# Patient Record
Sex: Female | Born: 1960 | Race: White | Hispanic: No | Marital: Married | Smoking: Never smoker
Health system: Southern US, Community
[De-identification: ages and names within clinical notes are randomized; demographics above are authoritative.]

## PROBLEM LIST (undated history)

## (undated) DIAGNOSIS — A498 Other bacterial infections of unspecified site: Secondary | ICD-10-CM

## (undated) DIAGNOSIS — E079 Disorder of thyroid, unspecified: Secondary | ICD-10-CM

## (undated) HISTORY — PX: OOPHORECTOMY: SHX86

## (undated) HISTORY — PX: ABDOMINAL HYSTERECTOMY: SHX81

## (undated) HISTORY — PX: CARPAL TUNNEL RELEASE: SHX101

---

## 1995-08-01 HISTORY — PX: ABDOMINAL HYSTERECTOMY: SHX81

## 2012-07-31 HISTORY — PX: OOPHORECTOMY: SHX86

## 2012-12-03 ENCOUNTER — Other Ambulatory Visit: Payer: Self-pay

## 2012-12-03 DIAGNOSIS — Z1231 Encounter for screening mammogram for malignant neoplasm of breast: Secondary | ICD-10-CM

## 2014-05-08 ENCOUNTER — Emergency Department (HOSPITAL_BASED_OUTPATIENT_CLINIC_OR_DEPARTMENT_OTHER)
Admission: EM | Admit: 2014-05-08 | Discharge: 2014-05-08 | Disposition: A | Discharge status: Home / Self Care | Payer: Commercial Indemnity | Attending: Emergency Medicine | Admitting: Emergency Medicine

## 2014-05-08 ENCOUNTER — Emergency Department (HOSPITAL_BASED_OUTPATIENT_CLINIC_OR_DEPARTMENT_OTHER): Payer: Commercial Indemnity

## 2014-05-08 ENCOUNTER — Encounter (HOSPITAL_BASED_OUTPATIENT_CLINIC_OR_DEPARTMENT_OTHER): Payer: Self-pay | Admitting: Emergency Medicine

## 2014-05-08 DIAGNOSIS — Z8639 Personal history of other endocrine, nutritional and metabolic disease: Secondary | ICD-10-CM | POA: Diagnosis not present

## 2014-05-08 DIAGNOSIS — M79604 Pain in right leg: Secondary | ICD-10-CM | POA: Insufficient documentation

## 2014-05-08 DIAGNOSIS — R2242 Localized swelling, mass and lump, left lower limb: Secondary | ICD-10-CM | POA: Insufficient documentation

## 2014-05-08 DIAGNOSIS — F419 Anxiety disorder, unspecified: Secondary | ICD-10-CM | POA: Insufficient documentation

## 2014-05-08 DIAGNOSIS — M79605 Pain in left leg: Secondary | ICD-10-CM | POA: Diagnosis present

## 2014-05-08 DIAGNOSIS — R0602 Shortness of breath: Secondary | ICD-10-CM | POA: Insufficient documentation

## 2014-05-08 DIAGNOSIS — R102 Pelvic and perineal pain: Secondary | ICD-10-CM | POA: Insufficient documentation

## 2014-05-08 DIAGNOSIS — M79606 Pain in leg, unspecified: Secondary | ICD-10-CM

## 2014-05-08 DIAGNOSIS — R2241 Localized swelling, mass and lump, right lower limb: Secondary | ICD-10-CM | POA: Diagnosis not present

## 2014-05-08 HISTORY — DX: Disorder of thyroid, unspecified: E07.9

## 2014-05-08 LAB — COMPREHENSIVE METABOLIC PANEL
ALK PHOS: 121 U/L — AB (ref 39–117)
ALT: 15 U/L (ref 0–35)
ANION GAP: 14 (ref 5–15)
AST: 27 U/L (ref 0–37)
Albumin: 4.2 g/dL (ref 3.5–5.2)
BILIRUBIN TOTAL: 0.5 mg/dL (ref 0.3–1.2)
BUN: 10 mg/dL (ref 6–23)
CHLORIDE: 102 meq/L (ref 96–112)
CO2: 28 mEq/L (ref 19–32)
Calcium: 10.1 mg/dL (ref 8.4–10.5)
Creatinine, Ser: 0.7 mg/dL (ref 0.50–1.10)
GFR calc non Af Amer: >90 mL/min (ref >90)
GLUCOSE: 100 mg/dL — AB (ref 70–99)
POTASSIUM: 3.6 meq/L — AB (ref 3.7–5.3)
Sodium: 144 mEq/L (ref 137–147)
Total Protein: 8.1 g/dL (ref 6.0–8.3)

## 2014-05-08 LAB — URINALYSIS, ROUTINE W REFLEX MICROSCOPIC
Bilirubin Urine: NEGATIVE
GLUCOSE, UA: NEGATIVE mg/dL
HGB URINE DIPSTICK: NEGATIVE
Ketones, ur: NEGATIVE mg/dL
Leukocytes, UA: NEGATIVE
Nitrite: NEGATIVE
Protein, ur: NEGATIVE mg/dL
SPECIFIC GRAVITY, URINE: 1.002 — AB (ref 1.005–1.030)
UROBILINOGEN UA: 0.2 mg/dL (ref 0.0–1.0)
pH: 7 (ref 5.0–8.0)

## 2014-05-08 LAB — CBC WITH DIFFERENTIAL/PLATELET
Basophils Absolute: 0 10*3/uL (ref 0.0–0.1)
Basophils Relative: 1 % (ref 0–1)
Eosinophils Absolute: 0.1 10*3/uL (ref 0.0–0.7)
Eosinophils Relative: 2 % (ref 0–5)
HCT: 41.2 % (ref 36.0–46.0)
HEMOGLOBIN: 13.9 g/dL (ref 12.0–15.0)
LYMPHS ABS: 1.4 10*3/uL (ref 0.7–4.0)
Lymphocytes Relative: 30 % (ref 12–46)
MCH: 31.6 pg (ref 26.0–34.0)
MCHC: 33.7 g/dL (ref 30.0–36.0)
MCV: 93.6 fL (ref 78.0–100.0)
MONOS PCT: 8 % (ref 3–12)
Monocytes Absolute: 0.4 10*3/uL (ref 0.1–1.0)
NEUTROS ABS: 2.8 10*3/uL (ref 1.7–7.7)
NEUTROS PCT: 59 % (ref 43–77)
Platelets: 217 10*3/uL (ref 150–400)
RBC: 4.4 MIL/uL (ref 3.87–5.11)
RDW: 12.2 % (ref 11.5–15.5)
WBC: 4.7 10*3/uL (ref 4.0–10.5)

## 2014-05-08 LAB — D-DIMER, QUANTITATIVE (NOT AT ARMC)

## 2014-05-08 NOTE — ED Notes (Signed)
Pt returned from radiology.

## 2014-05-08 NOTE — ED Provider Notes (Signed)
CSN: [id number]     Arrival date & time 05/08/14  1551 History   First MD Initiated Contact with Patient 05/08/14 1553     Chief Complaint  Patient presents with  . Leg Pain     (Consider location/radiation/quality/duration/timing/severity/associated sxs/prior Treatment) HPI 53 year old female presents today complaining of bilateral leg pain. She states that she has had leg pain for several months. She had an oophorectomy bilaterally in August of 2014. During the next several months after that she had some leg pain and swelling. She was eventually sent to a vascular surgeon. 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 She was told these were not DVTs and not need further treatment. She continued to have pain with central vascular surgeon. 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 He is scheduled to do a surgery in the near future. She states that she has had labs drawn by her primary care physician who is a gynecologist and been told that her calcium was slightly high. She has had some thyroid problems but is followed by a naturopathic physician for this and takes an over-the-counter medicine. She has had her thyroid screened and states that these have been normal. She states she's had ongoing pain and swelling in her legs and hands. She feels like the leg pain has worsened over the past several weeks with pain worse in the right leg than the left leg. She has been concerned about a DVT. The pain is worse with both rest and movement. She has taken over-the-counter pain medicine without relief. She has not had any trauma, redness, streaking, cords or other signs of problems in her lower extremities besides the pain and swelling. She felt when she walked in the door but she was anxious and was asked to she had shortness of breath. She states they then brought him that she  was short of breath making it is her chief complaint she has not had that as a chief complaint at home. She has not noted chest pain, productive cough, pleuritic pain, or weight loss. She states she does have some pelvic pressure and pain. She has not had any urinary tract infection symptoms but was seen and told that she had blood in her urine by her gynecologist approximately 6 weeks ago. She was put on a course of Bactrim and continued to have blood in her urine was sent to urologist. She states that they have told her they plan to do further exam of the inside of her bladder to make sure she does not have any tumor or cause of the bleeding. She has not noted this herself and has not seen any gross blood or had any pain. Past Medical History  Diagnosis Date  . Thyroid disease    Past Surgical History  Procedure Laterality Date  . Abdominal hysterectomy    . Oophorectomy     No family history on file. History  Substance Use Topics  . Smoking status: Never Smoker   . Smokeless tobacco: Not on file  . Alcohol Use: No   OB History   Grav Para Term Preterm Abortions TAB SAB Ect Mult Living                 Review of Systems  All other systems reviewed and are negative.     Allergies  Macrodantin; Morphine and related; and Reglan  Home Medications   Prior to Admission medications   Medication Sig Start Date End Date Taking? Authorizing Provider  NONFORMULARY OR COMPOUNDED ITEM    Yes Historical Provider, MD   BP 153/87  Pulse 88  Temp(Src) 97.8 F (36.6 C) (Oral)  Resp 16  Ht 5\' 4"  (1.626 m)  Wt 123 lb (55.792 kg)  BMI 21.10 kg/m2  SpO2 100% Physical Exam  Nursing note and vitals reviewed. Constitutional: She is oriented to person, place, and time. She appears well-developed and well-nourished.  HENT:  Head: Normocephalic and atraumatic.  Right Ear: External ear normal.  Left Ear: External ear normal.  Nose: Nose normal.  Mouth/Throat: Oropharynx is clear and moist.   Eyes: Conjunctivae and EOM are normal. Pupils are equal, round, and reactive to light.  Neck: Normal range of motion. Neck supple. No JVD present. No tracheal deviation present. No thyromegaly present.  Cardiovascular: Normal rate, regular rhythm, normal heart sounds and intact distal pulses.   Pulmonary/Chest: Effort normal and breath sounds normal. No respiratory distress. She has no wheezes.  Abdominal: Soft. Bowel sounds are normal. She exhibits no mass. There is no tenderness. There is no guarding.  Musculoskeletal: Normal range of motion.  ttp right medial thigh  Lymphadenopathy:    She has no cervical adenopathy.  Neurological: She is alert and oriented to person, place, and time. She has normal reflexes. No cranial nerve deficit or sensory deficit. Gait normal. GCS eye subscore is 4. GCS verbal subscore is 5. GCS motor subscore is 6.  Reflex Scores:      Bicep reflexes are 2+ on the right side and 2+ on the left side.      Patellar reflexes are 2+ on the right side and 2+ on the left side. Strength is 5/5 bilateral elbow flexor/extensors, wrist extension/flexion, intrinsic hand strength equal Bilateral hip flexion/extension 5/5, knee flexion/extension 5/5, ankle 5/5 flexion extension    Skin: Skin is warm and dry.  Psychiatric: She has a normal mood and affect. Her behavior is normal. Judgment and thought content normal.    ED Course  Procedures (including critical care time) Labs Review Labs Reviewed  URINALYSIS, ROUTINE W REFLEX MICROSCOPIC - Abnormal; Notable for the following:    Specific Gravity, Urine 1.002 (*)    All other components within normal limits  CBC WITH DIFFERENTIAL  COMPREHENSIVE METABOLIC PANEL  D-DIMER, QUANTITATIVE    Imaging Review Dg Chest 2 View  05/08/2014   CLINICAL DATA:  Leg pain. History of lower extremity blood clots. Shortness of breath.  EXAM: CHEST  2 VIEW  COMPARISON:  11/03/2013  FINDINGS: The heart size and mediastinal contours are  within normal limits. Both lungs are clear. The visualized skeletal structures are unremarkable.  IMPRESSION: No active cardiopulmonary disease.   Electronically Signed   By: Herbie Baltimore M.D.   On: 05/08/2014 16:35   US Transvaginal Non-ob  05/08/2014   CLINICAL DATA:  Patient complains of mid pelvic discomfort and pain since hysterectomy.  EXAM: TRANSABDOMINAL AND TRANSVAGINAL ULTRASOUND OF PELVIS  TECHNIQUE: Both transabdominal and transvaginal ultrasound examinations of the pelvis were performed. Transabdominal technique was performed for global imaging of the pelvis including uterus, ovaries, adnexal regions, and pelvic cul-de-sac. It was necessary to proceed with endovaginal exam following the transabdominal exam to visualize the pelvic structures.  COMPARISON:  None  FINDINGS: The uterus and bilateral ovaries are surgically absent. No pelvic masses are identified. No significant pelvic free fluid.  IMPRESSION: Surgically absent uterus and ovaries.  No pelvic mass identified.   Electronically Signed   By: Annia Belt M.D.   On: 05/08/2014 17:33   US Pelvis Complete  05/08/2014   CLINICAL DATA:  Patient complains of mid pelvic discomfort and pain since hysterectomy.  EXAM: TRANSABDOMINAL AND TRANSVAGINAL ULTRASOUND OF PELVIS  TECHNIQUE: Both transabdominal and transvaginal ultrasound examinations of the pelvis were performed. Transabdominal technique was performed for global imaging of the pelvis including uterus, ovaries, adnexal regions, and pelvic cul-de-sac. It was necessary to proceed with endovaginal exam following the transabdominal exam to visualize the pelvic structures.  COMPARISON:  None  FINDINGS: The uterus and bilateral ovaries are surgically absent. No pelvic masses are identified. No significant pelvic free fluid.  IMPRESSION: Surgically absent uterus and ovaries.  No pelvic mass identified.   Electronically Signed   By: Annia Belt M.D.   On: 05/08/2014 17:33   US Venous Img Lower  Bilateral  05/08/2014   CLINICAL DATA:  Bilateral lower extremity edema in the feet. Bilateral lower leg pain, right greater than left. Right groin pain. Prior diagnosis of superficial thrombophlebitis in the right leg at the vascular surgeon's office.  EXAM: BILATERAL LOWER EXTREMITY VENOUS DOPPLER ULTRASOUND  TECHNIQUE: Gray-scale sonography with graded compression, as well as color Doppler and duplex ultrasound were performed to evaluate the lower extremity deep venous systems from the level of the common femoral vein and including the common femoral, femoral, profunda femoral, popliteal and calf veins including the posterior tibial, peroneal and gastrocnemius veins when visible. The superficial great saphenous vein was also interrogated. Spectral Doppler was utilized to evaluate flow at rest and with distal augmentation maneuvers in the common femoral, femoral and popliteal veins.  COMPARISON:  None.  FINDINGS: RIGHT LOWER EXTREMITY  Common Femoral Vein: No evidence of thrombus. Normal compressibility, respiratory phasicity and response to augmentation.  Saphenofemoral Junction: No evidence of thrombus. Normal compressibility and flow on color Doppler imaging.  Profunda Femoral Vein: No evidence of thrombus. Normal compressibility and flow on color Doppler imaging.  Femoral Vein: No evidence of thrombus. Normal compressibility, respiratory phasicity and response to augmentation.  Popliteal Vein: No evidence of thrombus. Normal compressibility, respiratory phasicity and response to augmentation.  Calf Veins: No evidence of thrombus. Normal compressibility and flow on color Doppler imaging.  Superficial Great Saphenous Vein: No evidence of thrombus. Normal compressibility and flow on color Doppler imaging.  Venous Reflux:  Not examined.  Other Findings:  None.  LEFT LOWER EXTREMITY  Common Femoral Vein: No evidence of thrombus. Normal compressibility, respiratory phasicity and response to augmentation.   Saphenofemoral Junction: No evidence of thrombus. Normal compressibility and flow on color Doppler imaging.  Profunda Femoral Vein: No evidence of thrombus. Normal compressibility and flow on color Doppler imaging.  Femoral Vein: No evidence of thrombus. Normal compressibility, respiratory phasicity and response to augmentation.  Popliteal Vein: No evidence of thrombus. Normal compressibility, respiratory phasicity and response to augmentation.  Calf Veins: No evidence of thrombus. Normal compressibility and flow on color Doppler imaging.  Superficial Great Saphenous Vein: No evidence of thrombus. Normal compressibility and flow on color Doppler imaging.  Venous Reflux:  Not examined.  Other Findings:  None.  IMPRESSION: No evidence of DVT involving either the right or left lower extremity.   Electronically Signed   By: Hulan Saas M.D.   On: 05/08/2014 17:17     EKG Interpretation None      MDM   Final diagnoses:  Pain of lower extremity, unspecified laterality  Patient with bilateral leg pain right greater than rest and no evidence of DVT seen on her ultrasound. D-dimer is normal. Patient has normal electrolytes. I do not see an obvious cause of her pain. I discussed with her having followup to have her thyroid tested as her symptoms could be do to hypothyroidism. I have discussed the findings of her workup here with the patient and her husband and answered questions.    Hilario Quarryanielle S Blaze Sandin, MD 05/08/14 [phone]

## 2014-05-08 NOTE — Discharge Instructions (Signed)
Please followup with your Dr. for further tests of your thyroid. Please return if you're worse or feel that something different is occurring and we can reevaluate you at that time.

## 2014-05-08 NOTE — ED Notes (Signed)
Pt presents with chronic bilateral leg pain since April that is progressively worsening. [facility] contributes to feeling anxious now.

## 2014-05-08 NOTE — ED Notes (Signed)
MD at bedside. 

## 2015-06-16 ENCOUNTER — Ambulatory Visit: Payer: Self-pay | Admitting: Internal Medicine

## 2017-04-09 ENCOUNTER — Encounter (HOSPITAL_BASED_OUTPATIENT_CLINIC_OR_DEPARTMENT_OTHER): Payer: Self-pay | Admitting: *Deleted

## 2017-04-09 ENCOUNTER — Emergency Department (HOSPITAL_BASED_OUTPATIENT_CLINIC_OR_DEPARTMENT_OTHER): Payer: Managed Care, Other (non HMO)

## 2017-04-09 ENCOUNTER — Emergency Department (HOSPITAL_BASED_OUTPATIENT_CLINIC_OR_DEPARTMENT_OTHER)
Admission: EM | Admit: 2017-04-09 | Discharge: 2017-04-09 | Disposition: A | Discharge status: Home / Self Care | Payer: Managed Care, Other (non HMO) | Attending: Emergency Medicine | Admitting: Emergency Medicine

## 2017-04-09 DIAGNOSIS — E079 Disorder of thyroid, unspecified: Secondary | ICD-10-CM | POA: Diagnosis not present

## 2017-04-09 DIAGNOSIS — R079 Chest pain, unspecified: Secondary | ICD-10-CM | POA: Diagnosis not present

## 2017-04-09 DIAGNOSIS — M546 Pain in thoracic spine: Secondary | ICD-10-CM | POA: Diagnosis not present

## 2017-04-09 DIAGNOSIS — M549 Dorsalgia, unspecified: Secondary | ICD-10-CM | POA: Diagnosis present

## 2017-04-09 HISTORY — DX: Other bacterial infections of unspecified site: A49.8

## 2017-04-09 LAB — URINALYSIS, ROUTINE W REFLEX MICROSCOPIC
Bilirubin Urine: NEGATIVE
Glucose, UA: NEGATIVE mg/dL
HGB URINE DIPSTICK: NEGATIVE
Ketones, ur: NEGATIVE mg/dL
Leukocytes, UA: NEGATIVE
Nitrite: NEGATIVE
Protein, ur: NEGATIVE mg/dL
SPECIFIC GRAVITY, URINE: 1.01 (ref 1.005–1.030)
pH: 5.5 (ref 5.0–8.0)

## 2017-04-09 LAB — CBC WITH DIFFERENTIAL/PLATELET
BASOS PCT: 1 %
Basophils Absolute: 0 10*3/uL (ref 0.0–0.1)
EOS PCT: 2 %
Eosinophils Absolute: 0.1 10*3/uL (ref 0.0–0.7)
HCT: 40.9 % (ref 36.0–46.0)
HEMOGLOBIN: 13.8 g/dL (ref 12.0–15.0)
Lymphocytes Relative: 26 %
Lymphs Abs: 1.4 10*3/uL (ref 0.7–4.0)
MCH: 30.9 pg (ref 26.0–34.0)
MCHC: 33.7 g/dL (ref 30.0–36.0)
MCV: 91.7 fL (ref 78.0–100.0)
Monocytes Absolute: 0.5 10*3/uL (ref 0.1–1.0)
Monocytes Relative: 9 %
NEUTROS PCT: 62 %
Neutro Abs: 3.3 10*3/uL (ref 1.7–7.7)
PLATELETS: 210 10*3/uL (ref 150–400)
RBC: 4.46 MIL/uL (ref 3.87–5.11)
RDW: 12.4 % (ref 11.5–15.5)
WBC: 5.3 10*3/uL (ref 4.0–10.5)

## 2017-04-09 LAB — COMPREHENSIVE METABOLIC PANEL
ALK PHOS: 80 U/L (ref 38–126)
ALT: 16 U/L (ref 14–54)
AST: 30 U/L (ref 15–41)
Albumin: 4.4 g/dL (ref 3.5–5.0)
Anion gap: 7 (ref 5–15)
BILIRUBIN TOTAL: 1 mg/dL (ref 0.3–1.2)
BUN: 8 mg/dL (ref 6–20)
CALCIUM: 9.6 mg/dL (ref 8.9–10.3)
CO2: 29 mmol/L (ref 22–32)
CREATININE: 0.66 mg/dL (ref 0.44–1.00)
Chloride: 103 mmol/L (ref 101–111)
GFR calc non Af Amer: >60 mL/min (ref >60)
Glucose, Bld: 96 mg/dL (ref 65–99)
Potassium: 3.7 mmol/L (ref 3.5–5.1)
Sodium: 139 mmol/L (ref 135–145)
TOTAL PROTEIN: 7.8 g/dL (ref 6.5–8.1)

## 2017-04-09 LAB — D-DIMER, QUANTITATIVE: D-Dimer, Quant: 0.38 ug/mL-FEU (ref 0.00–0.50)

## 2017-04-09 MED ORDER — KETOROLAC TROMETHAMINE 30 MG/ML IJ SOLN
30.0000 mg | Freq: Once | INTRAMUSCULAR | Status: AC
  Administered 2017-04-09: 30 mg via INTRAVENOUS

## 2017-04-09 MED ORDER — KETOROLAC TROMETHAMINE 30 MG/ML IJ SOLN
30.0000 mg | Freq: Once | INTRAMUSCULAR | Status: DC
  Filled 2017-04-09: qty 1

## 2017-04-09 MED ORDER — ACETAMINOPHEN 500 MG PO TABS: 1000.0000 mg | ORAL_TABLET | Freq: Once | ORAL | Status: DC

## 2017-04-09 MED ORDER — TRAMADOL HCL 50 MG PO TABS: 50.0000 mg | ORAL_TABLET | Freq: Four times a day (QID) | ORAL | 0 refills | Status: DC | PRN

## 2017-04-09 MED ORDER — HYDROMORPHONE HCL 1 MG/ML IJ SOLN
1.0000 mg | Freq: Once | INTRAMUSCULAR | Status: DC
  Filled 2017-04-09: qty 1

## 2017-04-09 NOTE — ED Notes (Signed)
Pt reports that she can't take IV contrast.  It is not listed as an allergy on her chart.  I asked her what her reaction is and she states that "It messes with with my kidneys, I had trouble urinating and got stiff and aching and had a difficult time walking after that"

## 2017-04-09 NOTE — ED Notes (Signed)
MD with pt  

## 2017-04-09 NOTE — ED Notes (Signed)
Pt does not want the ordered pain medications.  Hot pack given

## 2017-04-09 NOTE — ED Notes (Signed)
To x-ray

## 2017-04-09 NOTE — ED Triage Notes (Signed)
Pt states she had a recent e.coli infection since the end of Aug. Has completed the treatment and had a f/u CT scan Friday that showed "constipation"  C/o nausea. Denies vomiting. States she feels boated. Took Mirilax Sunday with zero results. Pt is here today due to upper left back pain that started Sunday and got worse throughout the day. Describes as sharp. Denies any injury. States she pulled open her sliding glass door which could have contributed to pain.  Denies any sob.

## 2017-04-09 NOTE — ED Provider Notes (Signed)
[facility]   CSN: [id number] Arrival date & time: 04/09/17  0144     History   Chief Complaint Chief Complaint  Patient presents with  . mid upper back pain    HPI Leonetta Mcgivern is a 56 y.o. female.  Patient is a 56 year old female presenting with complaints of pain in her upper back and left lateral posterior chest. This has been ongoing for the past 2 days and began in the absence of any injury or trauma. Her pain is worse with deep breathing and position. She denies any fevers, chills, or productive cough. She was recently treated for a "Escherichia coli infection" with Cipro. She recently had an abdominal CT scan that confirmed resolution, however did appear as though she was constipated. She has tried taking ibuprofen with minimal relief.   The history is provided by the patient.    Past Medical History:  Diagnosis Date  . E coli infection   . Thyroid disease     There are no active problems to display for this patient.   Past Surgical History:  Procedure Laterality Date  . ABDOMINAL HYSTERECTOMY    . CARPAL TUNNEL RELEASE    . OOPHORECTOMY      OB History    No data available       Home Medications    Prior to Admission medications   Medication Sig Start Date End Date Taking? Authorizing Provider  NONFORMULARY OR COMPOUNDED ITEM     [provider]    Family History No family history on file.  Social History Social History  Substance Use Topics  . Smoking status: Never Smoker  . Smokeless tobacco: Never Used  . Alcohol use No     Allergies   Macrodantin [nitrofurantoin]; Morphine and related; Reglan [metoclopramide]; and Vicodin [hydrocodone-acetaminophen]   Review of Systems Review of Systems  All other systems reviewed and are negative.    Physical Exam Updated Vital Signs BP 138/73   Pulse 60   Temp 97.9 F (36.6 C)   Resp 18   Ht  (1.626 m)   Wt 54.9 kg (121 lb)   SpO2 100%   BMI  20.77 kg/m   Physical Exam  Constitutional: She is oriented to person, place, and time. She appears well-developed and well-nourished. No distress.  HENT:  Head: Normocephalic and atraumatic.  Neck: Normal range of motion. Neck supple.  Cardiovascular: Normal rate and regular rhythm.  Exam reveals no gallop and no friction rub.   No murmur heard. Pulmonary/Chest: Effort normal and breath sounds normal. No respiratory distress. She has no wheezes. She has no rales. She exhibits tenderness.  There is tenderness to palpation in the soft tissues of the left posterior chest wall and mid thoracic region.  Abdominal: Soft. Bowel sounds are normal. She exhibits no distension. There is no tenderness.  Musculoskeletal: Normal range of motion. She exhibits no edema.  Neurological: She is alert and oriented to person, place, and time.  Skin: Skin is warm and dry. She is not diaphoretic.  Nursing note and vitals reviewed.    ED Treatments / Results  Labs (all labs ordered are listed, but only abnormal results are displayed) Labs Reviewed  URINALYSIS, ROUTINE W REFLEX MICROSCOPIC  COMPREHENSIVE METABOLIC PANEL  CBC WITH DIFFERENTIAL/PLATELET  D-DIMER, QUANTITATIVE (NOT AT [facility])    EKG  EKG Interpretation None       Radiology No results found.  Procedures Procedures (including critical care time)  Medications Ordered in  ED Medications  HYDROmorphone (DILAUDID) injection 1 mg (not administered)  ketorolac (TORADOL) 30 MG/ML injection 30 mg (not administered)     Initial Impression / Assessment and Plan / ED Course  I have reviewed the triage vital signs and the nursing notes.  Pertinent labs & imaging results that were available during my care of the patient were reviewed by me and considered in my medical decision making (see chart for details).  Patient is a 56 year old female with recent diagnosis of Escherichia coli colitis presenting with complaints of pain in the left  posterior chest/left upper back that has been ongoing for the past 2 days. Her workup reveals no laboratory abnormality. Her d-dimer is negative which I feel rules out PE. Chest x-ray reveals no evidence for pneumothorax or bony abnormality.  Her exam and presentation is most consistent with a musculoskeletal etiology. She was given medicine here in the ER and is feeling better. I have found nothing here today that appears emergent. She will be discharged with pain medication and follow-up as needed.  Final Clinical Impressions(s) / ED Diagnoses   Final diagnoses:  None    New Prescriptions New Prescriptions   No medications on file     Geoffery Lyonselo, Rhianon Zabawa, MD 04/09/17 [phone]

## 2017-04-09 NOTE — ED Notes (Signed)
Heating pack and warm blankets given to pt

## 2017-04-09 NOTE — Discharge Instructions (Signed)
Tramadol as prescribed as needed for pain.  Return to the emergency department if you develop worsening pain, high fevers, difficulty breathing, or other new and concerning symptoms.

## 2017-04-09 NOTE — ED Notes (Signed)
[facility] Spoke with pt regarding the benefits of taking toradol. Spoke with Dr. Judd Lienelo and med reordered.

## 2017-05-11 ENCOUNTER — Ambulatory Visit: Payer: Self-pay | Admitting: Gynecology

## 2017-05-25 ENCOUNTER — Ambulatory Visit: Payer: Self-pay | Admitting: Gynecology

## 2017-07-12 ENCOUNTER — Ambulatory Visit: Payer: Self-pay | Admitting: Gynecology

## 2017-08-17 ENCOUNTER — Ambulatory Visit: Payer: Self-pay | Admitting: Gynecology

## 2017-09-25 ENCOUNTER — Ambulatory Visit: Payer: Self-pay | Admitting: Gynecology

## 2017-10-02 ENCOUNTER — Ambulatory Visit: Payer: Self-pay | Admitting: Gynecology

## 2017-11-30 ENCOUNTER — Ambulatory Visit: Payer: Self-pay | Admitting: Gynecology

## 2018-08-04 IMAGING — CR DG CHEST 2V
2 series · 2 of 2 positions shown · non-contrast
Comparison: 03/04/2015 Normal heart size and pulmonary vascularity.

CLINICAL DATA: Left upper back pain starting on [REDACTED] and
worsening throughout the day. Posterior chest wall pain.

EXAM:
CHEST  2 VIEW

[w chest pa]
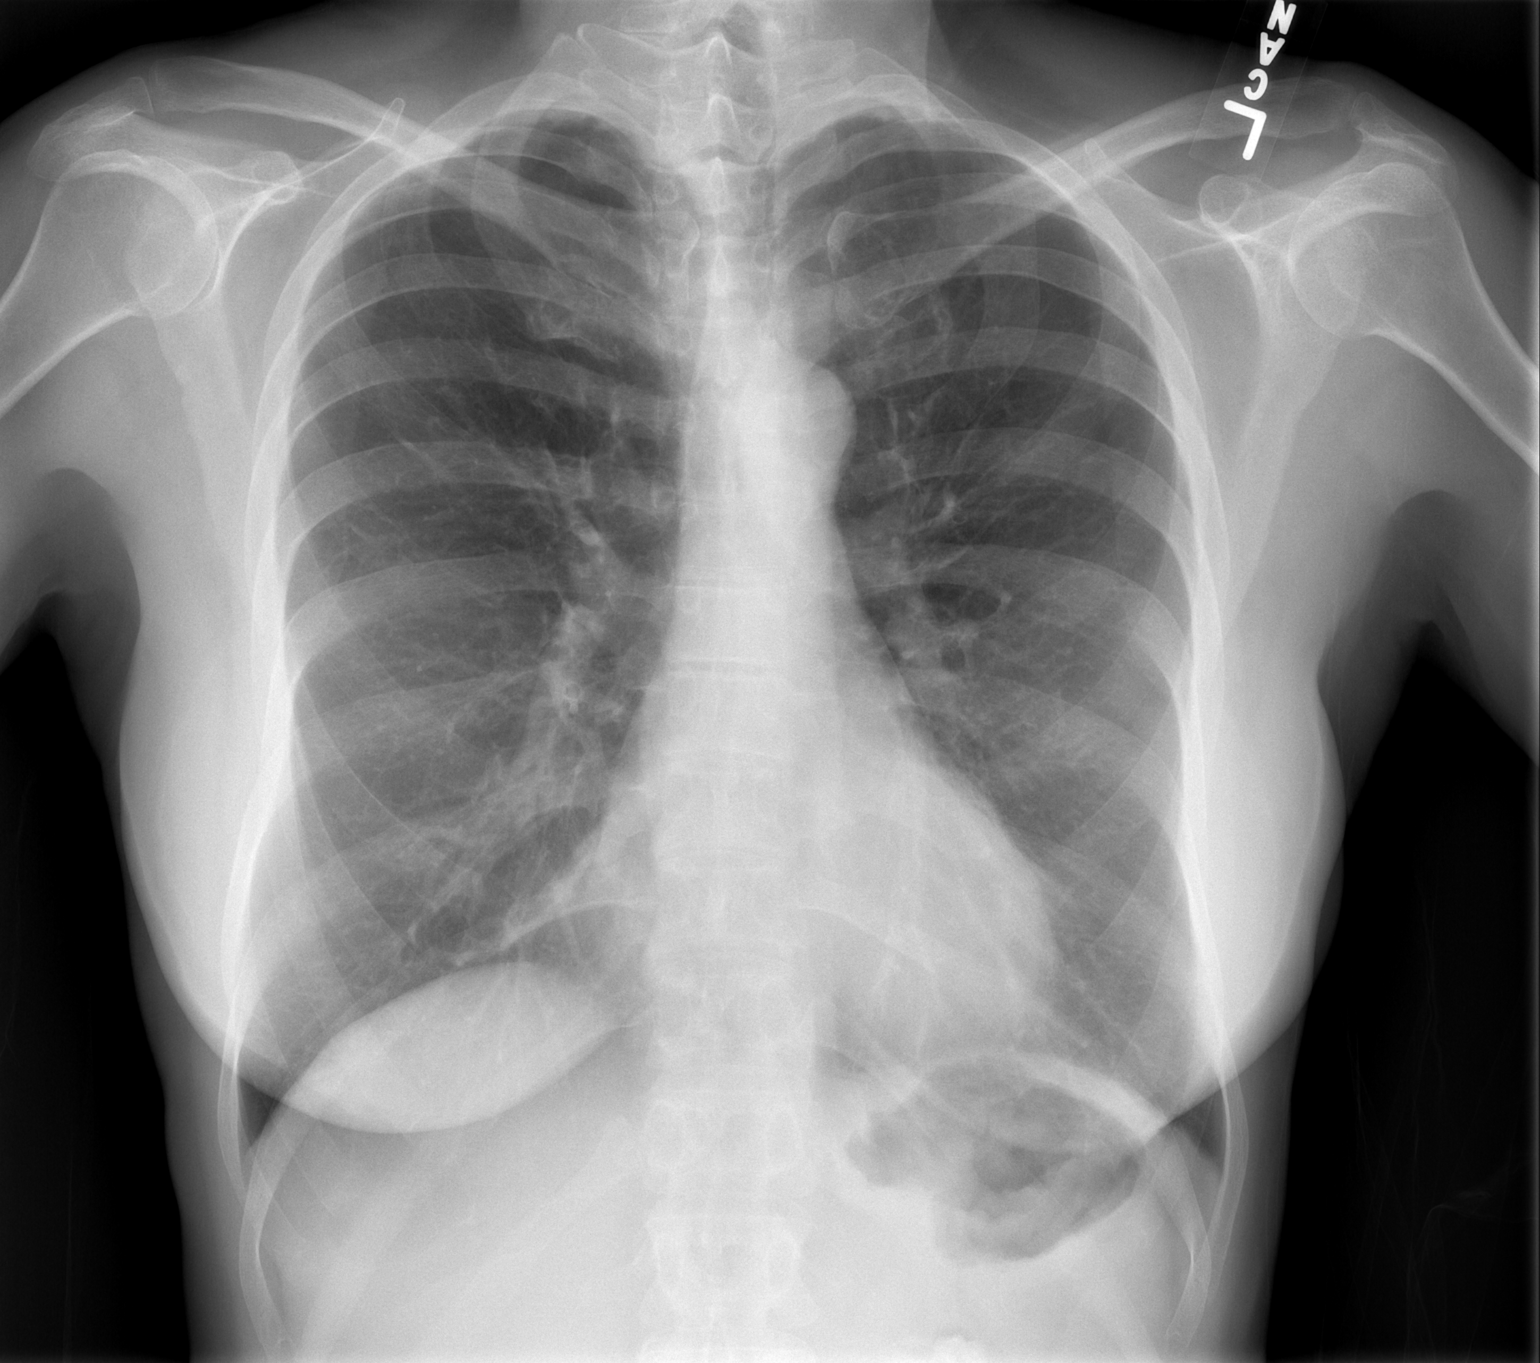

[w chest lat]
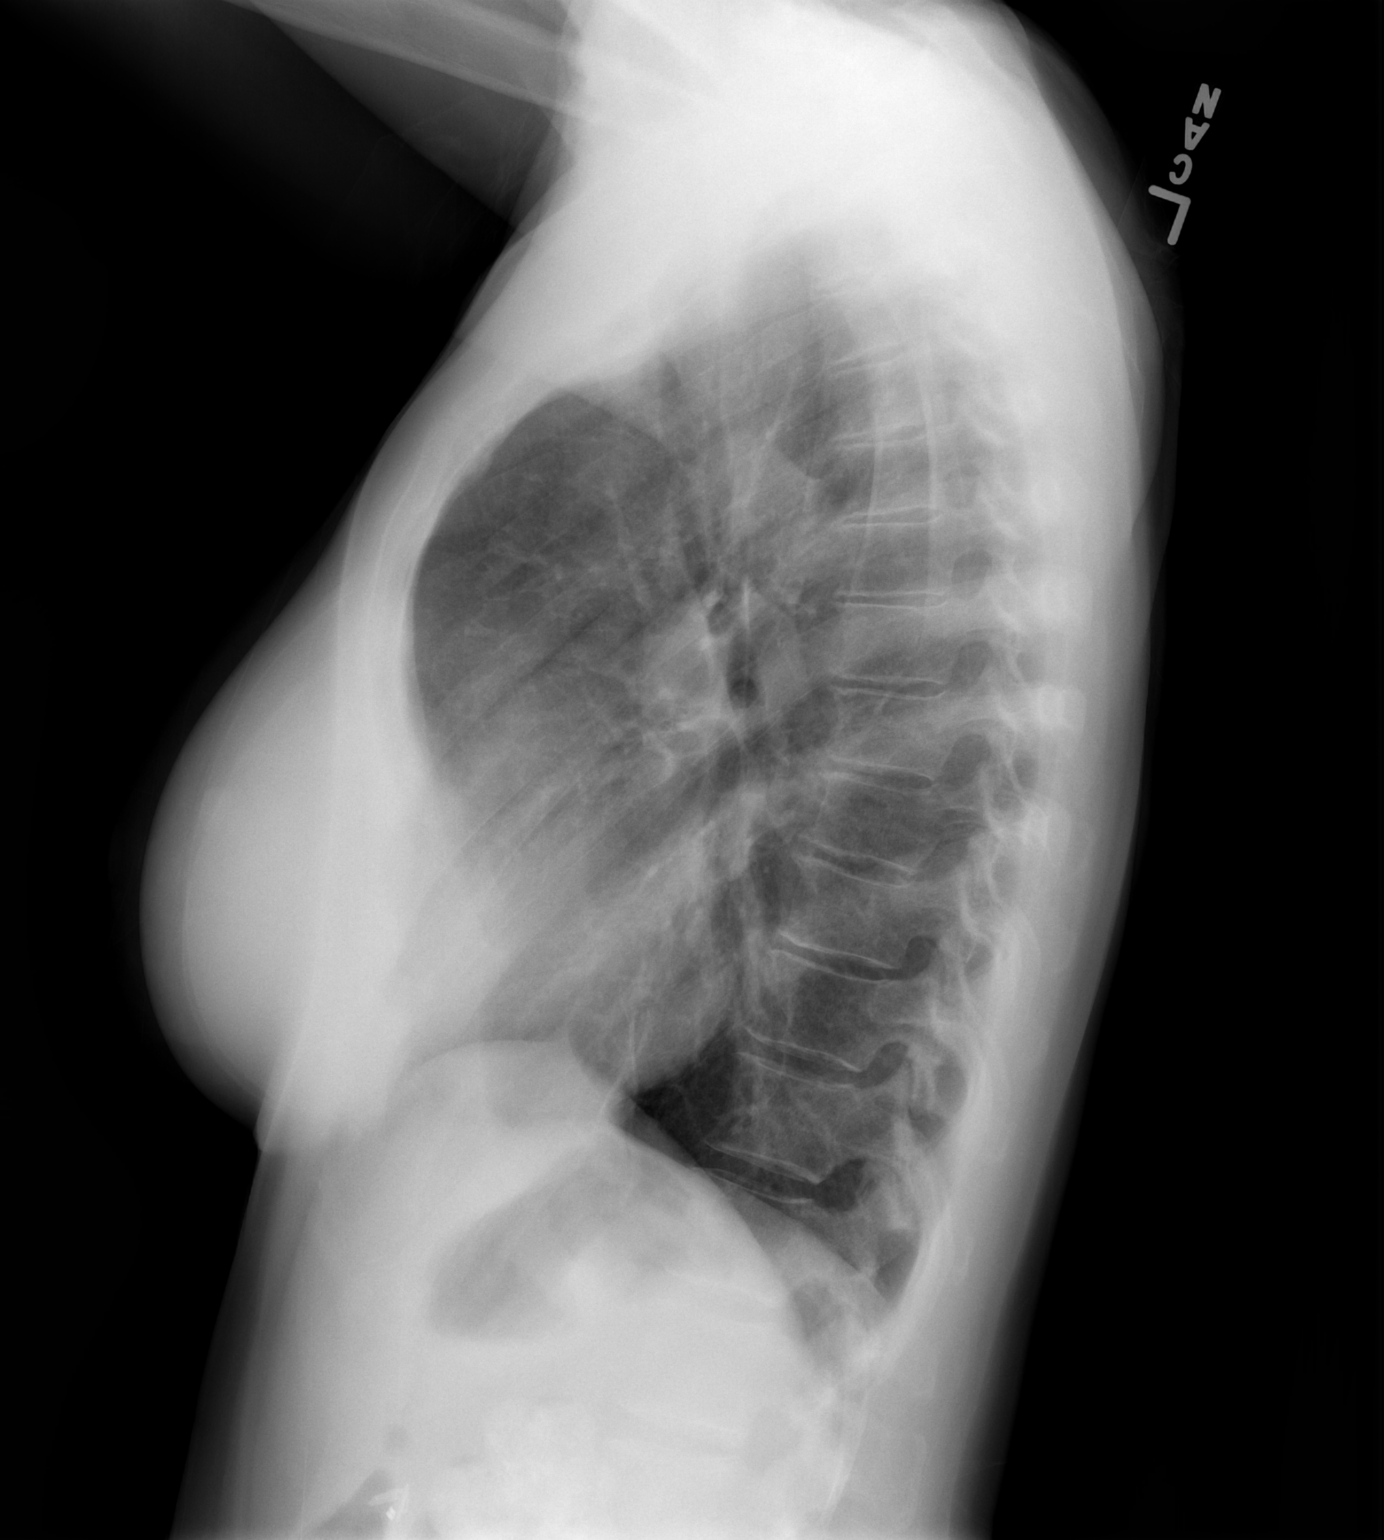

[2 of 2 positions shown; findings below may reference images not displayed]

No focal airspace disease or consolidation in the lungs. No blunting
of costophrenic angles. No pneumothorax. Mediastinal contours appear
intact.
FINDINGS: The heart size and mediastinal contours are within normal limits.
Both lungs are clear. The visualized skeletal structures are
unremarkable.
IMPRESSION: No active cardiopulmonary disease.

## 2019-04-29 ENCOUNTER — Encounter: Payer: Self-pay | Admitting: Gynecology

## 2024-03-05 ENCOUNTER — Other Ambulatory Visit (INDEPENDENT_AMBULATORY_CARE_PROVIDER_SITE_OTHER)

## 2024-03-05 ENCOUNTER — Encounter: Payer: Self-pay | Admitting: Gastroenterology

## 2024-03-05 ENCOUNTER — Ambulatory Visit (INDEPENDENT_AMBULATORY_CARE_PROVIDER_SITE_OTHER): Admitting: Gastroenterology

## 2024-03-05 ENCOUNTER — Ambulatory Visit
Admission: RE | Admit: 2024-03-05 | Discharge: 2024-03-05 | Disposition: A | Discharge status: Home / Self Care | Source: Ambulatory Visit | Attending: Gastroenterology

## 2024-03-05 ENCOUNTER — Ambulatory Visit: Payer: Self-pay | Admitting: Gastroenterology

## 2024-03-05 VITALS — BP 100/70 | HR 66 | Ht 64.25 in | Wt 117.0 lb

## 2024-03-05 DIAGNOSIS — Z8719 Personal history of other diseases of the digestive system: Secondary | ICD-10-CM | POA: Diagnosis not present

## 2024-03-05 DIAGNOSIS — R195 Other fecal abnormalities: Secondary | ICD-10-CM

## 2024-03-05 DIAGNOSIS — R194 Change in bowel habit: Secondary | ICD-10-CM | POA: Diagnosis not present

## 2024-03-05 DIAGNOSIS — Z800 Family history of malignant neoplasm of digestive organs: Secondary | ICD-10-CM | POA: Diagnosis not present

## 2024-03-05 DIAGNOSIS — R1013 Epigastric pain: Secondary | ICD-10-CM | POA: Diagnosis not present

## 2024-03-05 LAB — COMPREHENSIVE METABOLIC PANEL WITH GFR
ALT: 14 U/L (ref 0–35)
AST: 25 U/L (ref 0–37)
Albumin: 4.7 g/dL (ref 3.5–5.2)
Alkaline Phosphatase: 73 U/L (ref 39–117)
BUN: 12 mg/dL (ref 6–23)
CO2: 33 meq/L — ABNORMAL HIGH (ref 19–32)
Calcium: 9.7 mg/dL (ref 8.4–10.5)
Chloride: 101 meq/L (ref 96–112)
Creatinine, Ser: 0.74 mg/dL (ref 0.40–1.20)
GFR: 85.76 mL/min (ref >60.00)
Glucose, Bld: 96 mg/dL (ref 70–99)
Potassium: 3.9 meq/L (ref 3.5–5.1)
Sodium: 139 meq/L (ref 135–145)
Total Bilirubin: 0.8 mg/dL (ref 0.2–1.2)
Total Protein: 7.7 g/dL (ref 6.0–8.3)

## 2024-03-05 LAB — CBC WITH DIFFERENTIAL/PLATELET
Basophils Absolute: 0 K/uL (ref 0.0–0.1)
Basophils Relative: 1.1 % (ref 0.0–3.0)
Eosinophils Absolute: 0.1 K/uL (ref 0.0–0.7)
Eosinophils Relative: 1.4 % (ref 0.0–5.0)
HCT: 43.5 % (ref 36.0–46.0)
Hemoglobin: 14.6 g/dL (ref 12.0–15.0)
Lymphocytes Relative: 27.7 % (ref 12.0–46.0)
Lymphs Abs: 1.2 K/uL (ref 0.7–4.0)
MCHC: 33.6 g/dL (ref 30.0–36.0)
MCV: 91.4 fl (ref 78.0–100.0)
Monocytes Absolute: 0.4 K/uL (ref 0.1–1.0)
Monocytes Relative: 9.1 % (ref 3.0–12.0)
Neutro Abs: 2.6 K/uL (ref 1.4–7.7)
Neutrophils Relative %: 60.7 % (ref 43.0–77.0)
Platelets: 235 K/uL (ref 150.0–400.0)
RBC: 4.76 Mil/uL (ref 3.87–5.11)
RDW: 13 % (ref 11.5–15.5)
WBC: 4.3 K/uL (ref 4.0–10.5)

## 2024-03-05 LAB — LIPASE: Lipase: 34 U/L (ref 11.0–59.0)

## 2024-03-05 MED ORDER — OMEPRAZOLE 40 MG PO CPDR
40.0000 mg | DELAYED_RELEASE_CAPSULE | Freq: Every day | ORAL | 2 refills | Status: AC
Start: 2024-03-05 — End: ?

## 2024-03-05 NOTE — Patient Instructions (Addendum)
 Your provider has requested that you go to the basement level for lab work before leaving today. Press B on the elevator. The lab is located at the first door on the left as you exit the elevator. Also, after you leave the lab, go to the x-ray department.  Continue fiber supplement daily.  We have sent the following medications to your pharmacy for you to pick up at your convenience: omeprazole  40 mg daily.   You have been scheduled for an endoscopy. Please follow written instructions given to you at your visit today.  If you use inhalers (even only as needed), please bring them with you on the day of your procedure.  If you take any of the following medications, they will need to be adjusted prior to your procedure:   DO NOT TAKE 7 DAYS PRIOR TO TEST- Trulicity (dulaglutide) Ozempic, Wegovy (semaglutide) Mounjaro (tirzepatide) Bydureon Bcise (exanatide extended release)  DO NOT TAKE 1 DAY PRIOR TO YOUR TEST Rybelsus (semaglutide) Adlyxin (lixisenatide) Victoza (liraglutide) Byetta (exanatide) ___________________________________________________________________________  _______________________________________________________  If your blood pressure at your visit was 140/90 or greater, please contact your primary care physician to follow up on this.  _______________________________________________________  If you are age 4 or older, your body mass index should be between 23-30. Your Body mass index is 19.93 kg/m. If this is out of the aforementioned range listed, please consider follow up with your Primary Care Provider.  If you are age 75 or younger, your body mass index should be between 19-25. Your Body mass index is 19.93 kg/m. If this is out of the aformentioned range listed, please consider follow up with your Primary Care Provider.   ________________________________________________________  The [facility] providers would like to encourage you to use MYCHART to  communicate with providers for non-urgent requests or questions.  Due to long hold times on the telephone, sending your provider a message by [facility] may be a faster and more efficient way to get a response.  Please allow 48 business hours for a response.  Please remember that this is for non-urgent requests.  _______________________________________________________  [facility] is using a team-based approach to care.  Your team is made up of your doctor and two to three APPS. Our APPS (Nurse Practitioners and Physician Assistants) work with your physician to ensure care continuity for you. They are fully qualified to address your health concerns and develop a treatment plan. They communicate directly with your gastroenterologist to care for you. Seeing the Advanced Practice Practitioners on your physician's team can help you by facilitating care more promptly, often allowing for earlier appointments, access to diagnostic testing, procedures, and other specialty referrals.

## 2024-03-05 NOTE — Progress Notes (Unsigned)
 Sydney Morton [id number] August 13, 1960   Chief Complaint: Abdominal pain, loose stools  Referring Provider: No ref. provider found Primary GI MD: Sampson  HPI: Sydney Morton is a 63 y.o. female with past medical history of asthma, endometriosis, IBS, Lyme disease, ovarian cyst, vitamin B12 deficiency, osteomyelitis of left mandible, hypothyroidism, diverticulosis with history of diverticulitis, colon polyps, cholecystectomy, hysterectomy, who presents today for a complaint of abdominal pain and change in bowel habits.    Patient seen by digestive health in [facility] 11/05/2023 for evaluation of abdominal pain.  Is being followed by infectious disease for mandibular osteomyelitis.  Last colonoscopy April 2021 which was significant for polyp.  Recommended recall 5 years.  Has had 2 recent normal CT scans.  A recent CBC, CMP, ESR, CRP were relatively normal.  At time of that visit, reported she had returned from [place] recently and underwent breast surgery a few days prior and since that time had significant abdominal and back pain.  Reported abdominal distention after surgery as well as constipation, though at baseline alternates between diarrhea and constipation.  Noted severe lower back pain with radiation down both legs.  Dominant pain improved after taking a dose of magnesium citrate.  Has not used MiraLAX as it caused nausea in the past.  Reported several chronic digestive issues which were not concerning to her at that time.  Patient noted to appear uncomfortable and in acute pain on exam.  Pain in the back and radiating down the legs.  There was concern for a spinal issue rather than digestive issue, with recent abdominal discomfort likely related to postop ileus and use of narcotics, and much improved.  CT imaging did show significant constipation.  She was advised to use a Fleet enema that day and the next as well as senna, and was referred urgently to neurosurgery for  evaluation.   Patient states she had been having periodic loose stools for the last few months.  States she had stool tested for infection by PCP and was negative.  Stools have not been loose this week, and have been more formed, but typically have been loose/grassy looking.  Reports she has had digestive issues since she was 83.  From age [age] did fairly well but in recent years has had more problems.  She reports epigastric, postprandial abdominal pain ongoing for about 4 months.  Symptoms have become more frequent.  States she wakes up feeling bloated with a knotted feeling in her stomach.  She denies recent antibiotic use, but states she has history of osteomyelitis following a dental procedure and required PICC line with frequent antibiotics at that time.  Has been several months since she was on any antibiotics.  States she takes a probiotic and tries to eat probiotic rich foods.  Prior to onset of loose stools states she would have a bowel movement about 3 times a week.  When she has loose stools can have 6 bowel movements per day.  She has been found to have constipation on CT scanning despite feeling like her bowels are empty.  Reports she has a history of diverticulitis and has had flareups occasionally.  Endorses a vague abdominal pain which is there constantly.  Last CT scan done in April.  She denies prior EGD.  Denies heartburn or acid reflux.  Denies trouble swallowing.  She does have family history significant for maternal aunts with stomach cancer in her father has history of bleeding peptic ulcers.  Patient reports history of hemorrhoids which occasionally bleed.  Previous GI Procedures/Imaging   Colonoscopy 10/30/2019 ([facility]) 1 adenomatous colon polyp, recall 5 years   Past Medical History:  Diagnosis Date   E coli infection    Thyroid disease     Past Surgical History:  Procedure Laterality Date   ABDOMINAL HYSTERECTOMY     CARPAL TUNNEL RELEASE      OOPHORECTOMY      Current Outpatient Medications  Medication Sig Dispense Refill   NONFORMULARY OR COMPOUNDED ITEM      traMADol  (ULTRAM ) 50 MG tablet Take 1 tablet (50 mg total) by mouth every 6 (six) hours as needed. 12 tablet 0   No current facility-administered medications for this visit.    Allergies as of 03/05/2024 - Review Complete 03/05/2024  Allergen Reaction Noted   Ivp dye [iodinated contrast media]  04/09/2017   Macrodantin [nitrofurantoin] Swelling 05/08/2014   Morphine and codeine  05/08/2014   Reglan [metoclopramide]  05/08/2014   Vicodin [hydrocodone-acetaminophen ]  04/09/2017    History reviewed. No pertinent family history.  Social History   Tobacco Use   Smoking status: Never   Smokeless tobacco: Never  Substance Use Topics   Alcohol use: No   Drug use: No     Review of Systems:    Constitutional: No weight loss, fever, chills, weakness or fatigue Cardiovascular: No chest pain, chest pressure or palpitations   Respiratory: No SOB or cough Gastrointestinal: See HPI and otherwise negative Neurological: No headache, dizziness or syncope    Physical Exam:  Vital signs: BP 100/70   Pulse 66   Ht 5' 4.25 (1.632 m)   Wt 117 lb (53.1 kg)   BMI 19.93 kg/m   Constitutional: Pleasant, well-appearing female in NAD, alert and cooperative Head:  Normocephalic and atraumatic.  Eyes: No scleral icterus.  Respiratory: Respirations even and unlabored. Lungs clear to auscultation bilaterally.  No wheezes, crackles, or rhonchi.  Cardiovascular:  Regular rate and rhythm. No murmurs. No peripheral edema. Gastrointestinal:  Soft, nondistended, nontender. No rebound or guarding. Normal bowel sounds. No appreciable masses or hepatomegaly. Rectal:  Not performed.  Neurologic:  Alert and oriented x4;  grossly normal neurologically.  Skin:   Dry and intact without significant lesions or rashes. Psychiatric: Oriented to person, place and time. Demonstrates good  judgement and reason without abnormal affect or behaviors.   RELEVANT LABS AND IMAGING: CBC    Component Value Date/Time   WBC 5.3 04/09/2017 0220   RBC 4.46 04/09/2017 0220   HGB 13.8 04/09/2017 0220   HCT 40.9 04/09/2017 0220   PLT 210 04/09/2017 0220   MCV 91.7 04/09/2017 0220   MCH 30.9 04/09/2017 0220   MCHC 33.7 04/09/2017 0220   RDW 12.4 04/09/2017 0220   LYMPHSABS 1.4 04/09/2017 0220   MONOABS 0.5 04/09/2017 0220   EOSABS 0.1 04/09/2017 0220   BASOSABS 0.0 04/09/2017 0220    CMP     Component Value Date/Time   NA 139 04/09/2017 0220   K 3.7 04/09/2017 0220   CL 103 04/09/2017 0220   CO2 29 04/09/2017 0220   GLUCOSE 96 04/09/2017 0220   BUN 8 04/09/2017 0220   CREATININE 0.66 04/09/2017 0220   CALCIUM 9.6 04/09/2017 0220   PROT 7.8 04/09/2017 0220   ALBUMIN 4.4 04/09/2017 0220   AST 30 04/09/2017 0220   ALT 16 04/09/2017 0220   ALKPHOS 80 04/09/2017 0220   BILITOT 1.0 04/09/2017 0220   GFRNONAA >60 04/09/2017 0220   GFRAA >60 04/09/2017 0220     Assessment/Plan:  Change in bowel habits Loose stools History of chronic constipation Patient reports a few months of loose stools, though this has improved over the last week.  She has a history of chronic constipation and on CT in April was found to have a stool burden consistent with constipation.  States that she had stool testing done with her PCP and was negative for infection.  She does have significant history of antibiotic use for osteomyelitis which is now resolved.  Has been several months since she was on antibiotics.  She has been taking probiotics and eating probiotic rich foods.  - Request past GI records - Will order abdominal x-ray to rule out underlying constipation - Further workup for diarrhea pending results of KUB - Recommend fiber supplement  Epigastric abdominal pain Family history of stomach cancer Patient reports postprandial epigastric pain ongoing for the last 4 months.  She does  have family history of stomach cancer in her maternal aunts.  Also states that her father has a history of peptic ulcer disease.  Denies heartburn or acid reflux.  Reportedly no abnormal findings on recent CT scan.  - Schedule EGD. I thoroughly discussed the procedure with the patient to include nature of the procedure, alternatives, benefits, and risks (including but not limited to bleeding, infection, perforation, anesthesia/cardiac/pulmonary complications). Patient verbalized understanding and gave verbal consent to proceed with procedure.  - Labs today: CBC, CMP, lipase - Omeprazole  40 mg daily   Camie Furbish, PA-C [facility] 03/05/2024, 10:06 AM  Patient Care Team: Patient, No Pcp Per as PCP - General (General Practice)

## 2024-03-06 ENCOUNTER — Encounter: Payer: Self-pay | Admitting: Gastroenterology

## 2024-03-12 NOTE — Progress Notes (Signed)
 ____________________________________________________________  Attending physician addendum:  Thank you for sending this case to me. I have reviewed the entire note and agree with the plan.  Difficult to tell which of these issues is acute versus chronic given her standing GI symptoms.  At at least 2 other GI practices 5 to 7 years. There are not she has ever been tested or treated for SIBO  Victory Brand, MD  ____________________________________________________________

## 2024-03-14 ENCOUNTER — Telehealth: Payer: Self-pay | Admitting: Gastroenterology

## 2024-03-14 DIAGNOSIS — R194 Change in bowel habit: Secondary | ICD-10-CM

## 2024-03-14 DIAGNOSIS — Z800 Family history of malignant neoplasm of digestive organs: Secondary | ICD-10-CM

## 2024-03-14 DIAGNOSIS — R195 Other fecal abnormalities: Secondary | ICD-10-CM

## 2024-03-14 DIAGNOSIS — R1013 Epigastric pain: Secondary | ICD-10-CM

## 2024-03-14 NOTE — Telephone Encounter (Signed)
 Spoke with patient. She stated that when she had her ov with Camie, they discussed doing a SIBO test. Patient would like to know if she can pick up the test kit. Patient also wants to know if she can schedule a colonoscopy on the same day as her endoscopy. She believes she is due again in 2026, but she would like to go ahead and schedule it now if possible.

## 2024-03-14 NOTE — Telephone Encounter (Signed)
 Received call from patient, states she wanted to speak with someone regarding Test questions not discussed at last visit, and further in depth procedure questions. Please review and advise.  Thank you

## 2024-03-17 MED ORDER — NA SULFATE-K SULFATE-MG SULF 17.5-3.13-1.6 GM/177ML PO SOLN: 1.0000 | Freq: Once | ORAL | 0 refills | Status: AC

## 2024-03-17 NOTE — Addendum Note (Signed)
 Addended by: NICHOLAUS JARVIS on: 03/17/2024 09:41 AM   Modules accepted: Orders

## 2024-03-17 NOTE — Telephone Encounter (Signed)
 Called and spoke with patient. Discussed provider recommendations. Patient agrees to add colonoscopy. Was able to keep same date and time 04/15/2024 at 1230. Will send new instructions via mail and my chart per patient request. Prescription sent to patient's pharmacy of choice. Patient in agreement to complete SIBO test. Paperwork filled out and placed at 2nd floor front desk. Patient advised she can come pick it up at her convenience. Verbalized understanding.

## 2024-03-19 ENCOUNTER — Telehealth: Payer: Self-pay | Admitting: Gastroenterology

## 2024-03-19 NOTE — Telephone Encounter (Signed)
 Inbound call from patient requesting to know if she should continue with pulmonologist referral due to C02 being detected in her recent lab results. Requesting a call back. Please advise, thank you

## 2024-03-19 NOTE — Telephone Encounter (Signed)
 Referring to her Co2 level recent labs. Please advise.

## 2024-03-20 NOTE — Telephone Encounter (Signed)
 Patient advised.

## 2024-03-20 NOTE — Telephone Encounter (Signed)
 Patient advised. She is still waiting for the pulmonology referral through her PCP. She has called PCP in follow up regarding the referral. I will fax the labs to her PCP and update our records to show PCP name and office. Patient asks if we can refer her to pulmonology in [place].

## 2024-03-26 ENCOUNTER — Encounter: Admitting: Gastroenterology

## 2024-04-08 ENCOUNTER — Encounter: Payer: Self-pay | Admitting: Gastroenterology

## 2024-04-15 ENCOUNTER — Encounter: Payer: Self-pay | Admitting: Gastroenterology

## 2024-04-15 ENCOUNTER — Encounter: Admitting: Gastroenterology

## 2024-04-15 ENCOUNTER — Ambulatory Visit (AMBULATORY_SURGERY_CENTER): Admitting: Gastroenterology

## 2024-04-15 VITALS — BP 128/70 | HR 58 | Temp 98.0°F | Resp 15 | Ht 64.0 in | Wt 117.0 lb

## 2024-04-15 DIAGNOSIS — Q439 Congenital malformation of intestine, unspecified: Secondary | ICD-10-CM

## 2024-04-15 DIAGNOSIS — R194 Change in bowel habit: Secondary | ICD-10-CM

## 2024-04-15 DIAGNOSIS — K648 Other hemorrhoids: Secondary | ICD-10-CM

## 2024-04-15 DIAGNOSIS — K5140 Inflammatory polyps of colon without complications: Secondary | ICD-10-CM

## 2024-04-15 DIAGNOSIS — R1013 Epigastric pain: Secondary | ICD-10-CM

## 2024-04-15 DIAGNOSIS — D123 Benign neoplasm of transverse colon: Secondary | ICD-10-CM

## 2024-04-15 DIAGNOSIS — D120 Benign neoplasm of cecum: Secondary | ICD-10-CM

## 2024-04-15 DIAGNOSIS — Z800 Family history of malignant neoplasm of digestive organs: Secondary | ICD-10-CM

## 2024-04-15 DIAGNOSIS — K5730 Diverticulosis of large intestine without perforation or abscess without bleeding: Secondary | ICD-10-CM

## 2024-04-15 DIAGNOSIS — D122 Benign neoplasm of ascending colon: Secondary | ICD-10-CM

## 2024-04-15 DIAGNOSIS — K3189 Other diseases of stomach and duodenum: Secondary | ICD-10-CM | POA: Diagnosis not present

## 2024-04-15 DIAGNOSIS — R1084 Generalized abdominal pain: Secondary | ICD-10-CM

## 2024-04-15 MED ORDER — SODIUM CHLORIDE 0.9 % IV SOLN: 500.0000 mL | INTRAVENOUS | Status: DC

## 2024-04-15 NOTE — Op Note (Signed)
 [facility] Patient Name: Sydney Morton Procedure Date: 04/15/2024 1:39 PM MRN: [id number] Endoscopist: Victory L. Legrand , MD, [id number] Age: 63 Referring MD:  Date of Birth: 22-Sep-1960 Gender: Female Account #: [id number] Procedure:                Upper GI endoscopy Indications:              Generalized abdominal pain, Heartburn Medicines:                Monitored Anesthesia Care Procedure:                Pre-Anesthesia Assessment:                           - Prior to the procedure, a History and Physical                            was performed, and patient medications and                            allergies were reviewed. The patient's tolerance of                            previous anesthesia was also reviewed. The risks                            and benefits of the procedure and the sedation                            options and risks were discussed with the patient.                            All questions were answered, and informed consent                            was obtained. Prior Anticoagulants: The patient has                            taken no anticoagulant or antiplatelet agents. ASA                            Grade Assessment: II - A patient with mild systemic                            disease. After reviewing the risks and benefits,                            the patient was deemed in satisfactory condition to                            undergo the procedure.                           After obtaining informed consent, the endoscope was  passed under direct vision. Throughout the                            procedure, the patient's blood pressure, pulse, and                            oxygen saturations were monitored continuously. The                            Olympus Scope [id number] was introduced through the                            mouth, and advanced to the second part of duodenum.                            The upper  GI endoscopy was accomplished without                            difficulty. The patient tolerated the procedure                            well. Scope In: Scope Out: Findings:                 The esophagus was normal.                           Patchy mildly erythematous mucosa was found in the                            gastric body and in the gastric antrum. Several                            biopsies were obtained with cold forceps for                            histology. (Gastric antrum and body in same                            pathology jar to rule out H. pylori)                           The exam of the stomach was otherwise normal.                           The cardia and gastric fundus were normal on                            retroflexion. Stomach distended well with                            insufflation.                           Normal mucosa was found in the entire duodenum.  Biopsies for histology were taken with a cold                            forceps for evaluation of celiac disease. Complications:            No immediate complications. Estimated Blood Loss:     Estimated blood loss was minimal. Impression:               - Normal esophagus.                           - Erythematous mucosa in the gastric body and                            antrum.                           - Normal mucosa was found in the entire examined                            duodenum. Biopsied.                           - Several biopsies were obtained. Recommendation:           - Patient has a contact number available for                            emergencies. The signs and symptoms of potential                            delayed complications were discussed with the                            patient. Return to normal activities tomorrow.                            Written discharge instructions were provided to the                            patient.                            - Resume previous diet.                           - Continue present medications.                           - Await pathology results.                           - See the other procedure note for documentation of                            additional recommendations.  Patient's reluctance to take chronic medications if                            possible is understood. I recommend at least a                            trial of the omeprazole  that was recently                            prescribed to see if it may help decrease the                            heartburn and dyspeptic symptoms.                           If all biopsies come back unrevealing, recommend                            proceeding with the SIBO testing as discussed at                            the recent visit. Iliyana Convey L. Legrand, MD 04/15/2024 2:48:23 PM This report has been signed electronically.

## 2024-04-15 NOTE — Progress Notes (Signed)
 Called to room to assist during endoscopic procedure.  Patient ID and intended procedure confirmed with present staff. Received instructions for my participation in the procedure from the performing physician.

## 2024-04-15 NOTE — Patient Instructions (Signed)
 Discharge instruction given. Handouts on polyps,Diverticulosis and Hemorrhoids. Biopsies taken. Resume previous medications. YOU HAD AN ENDOSCOPIC PROCEDURE TODAY AT THE [facility]:   Refer to the procedure report that was given to you for any specific questions about what was found during the examination.  If the procedure report does not answer your questions, please call your gastroenterologist to clarify.  If you requested that your care partner not be given the details of your procedure findings, then the procedure report has been included in a sealed envelope for you to review at your convenience later.  YOU SHOULD EXPECT: Some feelings of bloating in the abdomen. Passage of more gas than usual.  Walking can help get rid of the air that was put into your GI tract during the procedure and reduce the bloating. If you had a lower endoscopy (such as a colonoscopy or flexible sigmoidoscopy) you may notice spotting of blood in your stool or on the toilet paper. If you underwent a bowel prep for your procedure, you may not have a normal bowel movement for a few days.  Please Note:  You might notice some irritation and congestion in your nose or some drainage.  This is from the oxygen used during your procedure.  There is no need for concern and it should clear up in a day or so.  SYMPTOMS TO REPORT IMMEDIATELY:  Following lower endoscopy (colonoscopy or flexible sigmoidoscopy):  Excessive amounts of blood in the stool  Significant tenderness or worsening of abdominal pains  Swelling of the abdomen that is new, acute  Fever of 100F or higher  Following upper endoscopy (EGD)  Vomiting of blood or coffee ground material  New chest pain or pain under the shoulder blades  Painful or persistently difficult swallowing  New shortness of breath  Fever of 100F or higher  Black, tarry-looking stools  For urgent or emergent issues, a gastroenterologist can be reached at any hour by  calling [phone]. Do not use MyChart messaging for urgent concerns.    DIET:  We do recommend a small meal at first, but then you may proceed to your regular diet.  Drink plenty of fluids but you should avoid alcoholic beverages for 24 hours.  ACTIVITY:  You should plan to take it easy for the rest of today and you should NOT DRIVE or use heavy machinery until tomorrow (because of the sedation medicines used during the test).    FOLLOW UP: Our staff will call the number listed on your records the next business day following your procedure.  We will call around 7:15- 8:00 am to check on you and address any questions or concerns that you may have regarding the information given to you following your procedure. If we do not reach you, we will leave a message.     If any biopsies were taken you will be contacted by phone or by letter within the next 1-3 weeks.  Please call us  at [phone] if you have not heard about the biopsies in 3 weeks.    SIGNATURES/CONFIDENTIALITY: You and/or your care partner have signed paperwork which will be entered into your electronic medical record.  These signatures attest to the fact that that the information above on your After Visit Summary has been reviewed and is understood.  Full responsibility of the confidentiality of this discharge information lies with you and/or your care-partner.

## 2024-04-15 NOTE — Progress Notes (Signed)
 History and Physical:  This patient presents for endoscopic testing for: Encounter Diagnoses  Name Primary?   Abdominal pain, epigastric Yes   Family history of stomach cancer    Change in bowel habits     63 year old woman with multiple chronic digestive symptoms outlined in 03/05/2024 office consultation note.  Altered bowel habits, abdominal pain.  She had a subsequent ED visit in the [facility] last week for upper abdominal burning discomfort radiating into the chest.  She ruled out for acute coronary syndrome.  CT abdomen and pelvis had some bladder wall thickening and question of UTI. She has had longstanding symptoms and extensive workup at previous GI practices as well.  Patient is otherwise without complaints or active issues today.   Past Medical History: Past Medical History:  Diagnosis Date   E coli infection    Thyroid disease      Past Surgical History: Past Surgical History:  Procedure Laterality Date   ABDOMINAL HYSTERECTOMY     CARPAL TUNNEL RELEASE     OOPHORECTOMY      Allergies: Allergies  Allergen Reactions   Ivp Dye [Iodinated Contrast Media]     Pt reports that she had a difficult time urinating and was achy and had a difficult time walking and was told not to take it anymore   Macrodantin [Nitrofurantoin] Swelling   Morphine And Codeine     makes me in a rage   Reglan [Metoclopramide]     face draws   Vicodin [Hydrocodone-Acetaminophen ]     Outpatient Meds: Current Outpatient Medications  Medication Sig Dispense Refill   NONFORMULARY OR COMPOUNDED ITEM      omeprazole  (PRILOSEC) 40 MG capsule Take 1 capsule (40 mg total) by mouth daily. 30 capsule 2   traMADol  (ULTRAM ) 50 MG tablet Take 1 tablet (50 mg total) by mouth every 6 (six) hours as needed. 12 tablet 0   Current Facility-Administered Medications  Medication Dose Route Frequency Provider Last Rate Last Admin   0.9 %  sodium chloride  infusion  500 mL Intravenous Continuous  Danis, Victory CROME III, MD          ___________________________________________________________________ Objective   Exam:  BP 133/76   Pulse 82   Temp 98 F (36.7 C) (Temporal)   Ht 5' 4 (1.626 m)   Wt 117 lb (53.1 kg)   SpO2 99%   BMI 20.08 kg/m   CV: regular , S1/S2 Resp: clear to auscultation bilaterally, normal RR and effort noted GI: soft, no tenderness, with active bowel sounds.   Assessment: Encounter Diagnoses  Name Primary?   Abdominal pain, epigastric Yes   Family history of stomach cancer    Change in bowel habits      Plan: Colonoscopy EGD  The benefits and risks of the planned procedure(s) were described in detail with the patient or (when appropriate) their health care proxy.  Risks were outlined as including, but not limited to, bleeding, infection, perforation, adverse medication reaction leading to cardiac or pulmonary decompensation, pancreatitis (if ERCP).  The limitation of incomplete mucosal visualization was also discussed.  No guarantees or warranties were given.  The patient is appropriate for an endoscopic procedure in the ambulatory setting.   - Victory Brand, MD

## 2024-04-15 NOTE — Progress Notes (Signed)
 To PACU via stretcher, sedated, good respiratory effort, VSS.

## 2024-04-15 NOTE — Progress Notes (Signed)
 Pt's states no medical or surgical changes since previsit or office visit.

## 2024-04-15 NOTE — Op Note (Signed)
 [facility] Patient Name: Sydney Morton Procedure Date: 04/15/2024 1:40 PM MRN: [id number] Endoscopist: Victory L. Legrand , MD, [id number] Age: 63 Referring MD:  Date of Birth: 10-29-1960 Gender: Female Account #: [id number] Procedure:                Colonoscopy Indications:              Generalized abdominal pain, Change in bowel habits                            (alternating diarrhea and constipation) Medicines:                Monitored Anesthesia Care Procedure:                Pre-Anesthesia Assessment:                           - Prior to the procedure, a History and Physical                            was performed, and patient medications and                            allergies were reviewed. The patient's tolerance of                            previous anesthesia was also reviewed. The risks                            and benefits of the procedure and the sedation                            options and risks were discussed with the patient.                            All questions were answered, and informed consent                            was obtained. Prior Anticoagulants: The patient has                            taken no anticoagulant or antiplatelet agents. ASA                            Grade Assessment: II - A patient with mild systemic                            disease. After reviewing the risks and benefits,                            the patient was deemed in satisfactory condition to                            undergo the procedure.  After obtaining informed consent, the colonoscope                            was passed under direct vision. Throughout the                            procedure, the patient's blood pressure, pulse, and                            oxygen saturations were monitored continuously. The                            PCF-HQ190L Colonoscope [id number] was introduced                            through the anus  and advanced to the the terminal                            ileum, with identification of the appendiceal                            orifice and IC valve. The colonoscopy was performed                            with difficulty due to a redundant colon and a                            tortuous colon. Successful completion of the                            procedure was aided by using manual pressure,                            straightening and shortening the scope to obtain                            bowel loop reduction and lavage. The patient                            tolerated the procedure well. The quality of the                            bowel preparation was good except the cecum and                            proximal ascending colon were fair despite lavage.                            The terminal ileum, ileocecal valve, appendiceal                            orifice, and rectum were photographed. Scope In: 1:56:00 PM Scope Out: 2:22:46 PM Scope Withdrawal Time: 0 hours 16 minutes 30  seconds  Total Procedure Duration: 0 hours 26 minutes 46 seconds  Findings:                 The perianal exam findings include partially                            prolapsed internal hemorrhoids                           The terminal ileum appeared normal.                           The left colon was tortuous. Most of colon                            redundant as well.                           Diverticula were found in the left colon.                           A diminutive polyp was found in the ileocecal                            valve. The polyp was semi-sessile. The polyp was                            removed with a cold biopsy forceps. Resection and                            retrieval were complete. (Jar 1)                           A diminutive polyp was found in the proximal                            ascending colon. The polyp was sessile. The polyp                            was  removed with a cold biopsy forceps. Resection                            and retrieval were complete. (Jar 2)                           A diminutive polyp was found in the transverse                            colon. The polyp was sessile. The polyp was removed                            with a cold snare. Resection and retrieval were                            complete. (  Jar 4)                           Normal mucosa was found in the entire colon.                            Biopsies for histology were taken with a cold                            forceps from the right colon and left colon for                            evaluation of microscopic colitis. (Jar 3)                           Internal hemorrhoids were found.                           The exam was otherwise without abnormality on                            direct and retroflexion views. Complications:            No immediate complications. Estimated Blood Loss:     Estimated blood loss was minimal. Impression:               - Internal hemorrhoids that prolapse with                            straining, but spontaneously regress to the resting                            position (Grade II) found on perianal exam.                           - The examined portion of the ileum was normal.                           - Tortuous colon.                           - Diverticulosis in the left colon.                           - One diminutive polyp at the ileocecal valve,                            removed with a cold biopsy forceps. Resected and                            retrieved.                           - One diminutive polyp in the proximal ascending  colon, removed with a cold biopsy forceps. Resected                            and retrieved.                           - One diminutive polyp in the transverse colon,                            removed with a cold snare. Resected and retrieved.                            - Normal mucosa in the entire examined colon.                            Biopsied.                           - Internal hemorrhoids.                           - The examination was otherwise normal on direct                            and retroflexion views. Recommendation:           - Patient has a contact number available for                            emergencies. The signs and symptoms of potential                            delayed complications were discussed with the                            patient. Return to normal activities tomorrow.                            Written discharge instructions were provided to the                            patient.                           - Resume previous diet.                           - Continue present medications.                           - Await pathology results.                           - Repeat colonoscopy is recommended for                            surveillance. The colonoscopy date will be  determined after pathology results from today's                            exam become available for review. (Short side of                            surveillance interval due to prep details noted                            above)                           - See the other procedure note for documentation of                            additional recommendations. Novice Vrba L. Legrand, MD 04/15/2024 2:30:18 PM This report has been signed electronically.

## 2024-04-16 ENCOUNTER — Telehealth: Payer: Self-pay | Admitting: *Deleted

## 2024-04-16 NOTE — Telephone Encounter (Signed)
 No answer after post call. Left a message.

## 2024-04-17 ENCOUNTER — Telehealth: Payer: Self-pay | Admitting: Gastroenterology

## 2024-04-17 NOTE — Telephone Encounter (Signed)
 Spoke with the patient. She is agreeable to this plan. She is still in pain. No decrease in the severity. She has been moving around carefully today.

## 2024-04-17 NOTE — Telephone Encounter (Signed)
 Received a call from patient stating she is experiencing uncommon post procedure symptoms after ENDO/COLON on 9/16. Patient is requesting nusre fu call. Please review and advise   Thank you

## 2024-04-17 NOTE — Telephone Encounter (Signed)
 Sorry to hear she is not feeling well after the procedure.  While only cold snare polypectomies of small polyps were performed, it was a difficult procedure due to a challenging colon anatomy.  My recommendation is that she get seen at an emergency department today, ideally one of the [facility] health facilities where our practice can see her if needed.  She lives in [place], so perhaps the freestanding [facility] in [place] would be best, but I will leave that to her discretion.  I copied this to our on-call physician tonight.  Please check up on this tomorrow while I am out of the office to see the outcome of her visit.  - H. Danis

## 2024-04-17 NOTE — Telephone Encounter (Addendum)
 Reports pain mostly in her LLQ per her. She has passed a lot of gas, especially yesterday. Did not sleep well because of the pain. She used a heating pad for comfort and because he felt very cold last night. She also took Tylenol  which did not give any relief. Today when she sat down in the kitchen chair, a sharp pain shot through me that took my breath. She  does not feel the pain is decreasing. She did try to call back yesterday when she missed the post procedure call, but I couldn't get an answer.

## 2024-04-18 LAB — SURGICAL PATHOLOGY

## 2024-04-21 ENCOUNTER — Ambulatory Visit: Payer: Self-pay | Admitting: Gastroenterology

## 2024-04-22 ENCOUNTER — Ambulatory Visit (INDEPENDENT_AMBULATORY_CARE_PROVIDER_SITE_OTHER): Admitting: Gastroenterology

## 2024-04-22 ENCOUNTER — Encounter: Payer: Self-pay | Admitting: Gastroenterology

## 2024-04-22 VITALS — BP 108/64 | HR 64 | Ht 64.25 in | Wt 118.0 lb

## 2024-04-22 DIAGNOSIS — Z860101 Personal history of adenomatous and serrated colon polyps: Secondary | ICD-10-CM

## 2024-04-22 DIAGNOSIS — R140 Abdominal distension (gaseous): Secondary | ICD-10-CM

## 2024-04-22 DIAGNOSIS — R198 Other specified symptoms and signs involving the digestive system and abdomen: Secondary | ICD-10-CM | POA: Diagnosis not present

## 2024-04-22 DIAGNOSIS — R1013 Epigastric pain: Secondary | ICD-10-CM | POA: Diagnosis not present

## 2024-04-22 DIAGNOSIS — R195 Other fecal abnormalities: Secondary | ICD-10-CM

## 2024-04-22 DIAGNOSIS — K5900 Constipation, unspecified: Secondary | ICD-10-CM

## 2024-04-22 NOTE — Patient Instructions (Addendum)
 _______________________________________________________  If your blood pressure at your visit was 140/90 or greater, please contact your primary care physician to follow up on this.  _______________________________________________________  If you are age 64 or older, your body mass index should be between 23-30. Your Body mass index is 20.1 kg/m. If this is out of the aforementioned range listed, please consider follow up with your Primary Care Provider.  If you are age 34 or younger, your body mass index should be between 19-25. Your Body mass index is 20.1 kg/m. If this is out of the aformentioned range listed, please consider follow up with your Primary Care Provider.   ________________________________________________________  The [facility] providers would like to encourage you to use MYCHART to communicate with providers for non-urgent requests or questions.  Due to long hold times on the telephone, sending your provider a message by [facility] may be a faster and more efficient way to get a response.  Please allow 48 business hours for a response.  Please remember that this is for non-urgent requests.  _______________________________________________________  [facility] is using a team-based approach to care.  Your team is made up of your doctor and two to three APPS. Our APPS (Nurse Practitioners and Physician Assistants) work with your physician to ensure care continuity for you. They are fully qualified to address your health concerns and develop a treatment plan. They communicate directly with your gastroenterologist to care for you. Seeing the Advanced Practice Practitioners on your physician's team can help you by facilitating care more promptly, often allowing for earlier appointments, access to diagnostic testing, procedures, and other specialty referrals.   You have been given a testing kit to check for small intestine bacterial overgrowth (SIBO) which is completed by a  company named [employer]. Make sure to return your test in the mail using the return mailing label given to you along with the kit. The test order, your demographic and insurance information have all already been sent to the company. [facility] will collect an upfront charge of $109.00 for commercial insurance plans and $229.00 if you are paying cash. The potential remaining total after claim submission and review is $120.00. Make sure to discuss with [facility] PRIOR to having the test to see if they have gotten information from your insurance company as to how much your testing will cost out of pocket, if any. Please contact [facility] at phone number [phone] to get instructions regarding how to perform the test as our office is unable to give specific testing instructions.   START: omeprazole  40mg  daily  Please purchase the following medications over the counter and take as directed:  START: Benefiber 1 tablespoon daily  START: Miralax 1 capful daily as needed constipation  Due to recent changes in healthcare laws, you may see the results of your imaging and laboratory studies on MyChart before your provider has had a chance to review them.  We understand that in some cases there may be results that are confusing or concerning to you. Not all laboratory results come back in the same time frame and the provider may be waiting for multiple results in order to interpret others.  Please give us  48 hours in order for your provider to thoroughly review all the results before contacting the office for clarification of your results.   Thank you for entrusting me with your care and choosing [facility].  Camie Furbish, PA-C

## 2024-04-22 NOTE — Progress Notes (Signed)
 Sydney Morton [id number] Jul 08, 1961   Chief Complaint: Abdominal pain  Referring Provider: No ref. provider found Primary GI MD: Dr. Legrand  HPI: Sydney Morton is a 63 y.o. female with past medical history of asthma, endometriosis, IBS, Lyme disease, ovarian cyst, vitamin B12 deficiency, osteomyelitis of left mandible, hypothyroidism, diverticulosis with history of diverticulitis, colon polyps, cholecystectomy, hysterectomy who presents today for follow up.    Patient seen by digestive health in [facility] 11/05/2023 for evaluation of abdominal pain. Is being followed by infectious disease for mandibular osteomyelitis. Last colonoscopy April 2021 which was significant for polyp. Recommended recall 5 years. Has had 2 recent normal CT scans. A recent CBC, CMP, ESR, CRP were relatively normal. At time of that visit, reported she had returned from [place] recently and underwent breast surgery a few days prior and since that time had significant abdominal and back pain. Reported abdominal distention after surgery as well as constipation, though at baseline alternates between diarrhea and constipation. Noted severe lower back pain with radiation down both legs. Dominant pain improved after taking a dose of magnesium citrate. Has not used MiraLAX as it caused nausea in the past. Reported several chronic digestive issues which were not concerning to her at that time. Patient noted to appear uncomfortable and in acute pain on exam. Pain in the back and radiating down the legs. There was concern for a spinal issue rather than digestive issue, with recent abdominal discomfort likely related to postop ileus and use of narcotics, and much improved. CT imaging did show significant constipation. She was advised to use a Fleet enema that day and the next as well as senna, and was referred urgently to neurosurgery for evaluation.   Initially seen in our office 03/05/2024 at which time patient endorsed a few months of  loose stools with some improvement over the previous week.  History of chronic constipation and on CT in April was found to have stool burden consistent with constipation.  Reported having stool testing done with PCP and negative for infection. Also endorsed postprandial epigastric pain ongoing for 4 months, with family history of stomach cancer in maternal aunts and father with history of PUD.  She was started on omeprazole  and scheduled for EGD.  Labs were normal.  KUB showed moderate stool burden consistent with constipation and patient was advised to complete bowel purge.  Colonoscopy added to EGD.  She underwent EGD/colonoscopy 04/15/2024.  On colonoscopy found to have 3 diminutive polyps, 1 of which was a tubular adenoma, tortuous colon, diverticulosis, internal hemorrhoids.  Advised to have recall in 3 years. On EGD found to have erythematous mucosa in the gastric antrum and body and biopsies were negative for H. Pylori.   ----------------------------------TODAY----------------------------------------  Patient states loose stools have improved and she is doing better from this standpoint, but is not having regular bowel movements.  States that after having her colonoscopy things seem pretty normal for a few days, but then started going longer between bowel movements, feels she is having some constipation.  States she has been trying to eat healthy, admits she does not drink enough water.  Denies any blood in her stool or melena.  Denies any heartburn or acid reflux but does continue to have intermittent burning epigastric pain which occurs at random and is not necessarily associated with eating.  Denies any abdominal bloating.  Does have omeprazole  40 mg at home but has not been taking as she has been trying to avoid medications as much as possible.  States she recently had a root canal and subsequently required antibiotics.  Is on Keflex.  Also found to have a UTI.  Since starting antibiotics  has also developed a yeast infection.  Planning to have dental implants done at [facility] on Tuesday.  No other concerns today.  Previous GI Procedures/Imaging   Colonoscopy 04/15/2024 - Internal hemorrhoids that prolapse with straining, but spontaneously regress to the resting position ( Grade II) found on perianal exam.  - The examined portion of the ileum was normal.  - Tortuous colon.  - Diverticulosis in the left colon.  - One diminutive polyp at the ileocecal valve, removed with a cold biopsy forceps. Resected and retrieved.  - One diminutive polyp in the proximal ascending colon, removed with a cold biopsy forceps. Resected and retrieved.  - One diminutive polyp in the transverse colon, removed with a cold snare. Resected and retrieved.  - Normal mucosa in the entire examined colon. Biopsied.  - Internal hemorrhoids.  - The examination was otherwise normal on direct and retroflexion views. - Recall 3 years Path: 1. Surgical [P], small bowel, ileocecal valve, polyp (1) :      - TUBULAR ADENOMA      - NEGATIVE FOR HIGH-GRADE DYSPLASIA OR MALIGNANCY       2. Surgical [P], colon, ascending, polyp (1) :      - INFLAMMATORY POLYP      - NEGATIVE FOR DYSPLASIA       3. Surgical [P], colon nos, random sites :      - COLONIC MUCOSA WITH NO SPECIFIC HISTOPATHOLOGIC CHANGES      - NEGATIVE FOR ACUTE INFLAMMATION, INCREASED INTRAEPITHELIAL LYMPHOCYTES OR      THICKENED SUBEPITHELIAL COLLAGEN TABLE       4. Surgical [P], colon, transverse, polyp (1) :      - INFLAMMATORY POLYP      - NEGATIVE FOR DYSPLASIA  EGD 04/15/2024 - Normal esophagus.  - Erythematous mucosa in the gastric body and antrum.  - Normal mucosa was found in the entire examined duodenum. Biopsied.  - Several biopsies were obtained. Path: 5. Surgical [P], duodenal bulb, 2nd portion of duodenum and distal duodenum :      - DUODENAL MUCOSA WITH NO SPECIFIC HISTOPATHOLOGIC CHANGES      - NEGATIVE FOR INCREASED  INTRAEPITHELIAL LYMPHOCYTES OR VILLOUS ARCHITECTURAL      CHANGES       6. Surgical [P], gastric antrum and gastric body :      - GASTRIC ANTRAL AND OXYNTIC MUCOSA WITH MILD NONSPECIFIC REACTIVE GASTROPATHY      - HELICOBACTER PYLORI-LIKE ORGANISMS ARE NOT IDENTIFIED ON ROUTINE H&E STAIN   Colonoscopy 10/30/2019 ([facility]) 1 adenomatous colon polyp, recall 5 years   Past Medical History:  Diagnosis Date   E coli infection    Thyroid disease     Past Surgical History:  Procedure Laterality Date   ABDOMINAL HYSTERECTOMY  1997   partial   CARPAL TUNNEL RELEASE Right    OOPHORECTOMY  2014    Current Outpatient Medications  Medication Sig Dispense Refill   AMBULATORY NON FORMULARY MEDICATION Medication Name: compounded estrogen , puts it under her tongue daily     cephALEXin (KEFLEX) 500 MG capsule Take 500 mg by mouth 4 (four) times daily.     cholecalciferol (VITAMIN D3) 25 MCG (1000 UNIT) tablet Take 1,000 Units by mouth daily.     Cyanocobalamin (B-12 COMPLIANCE INJECTION IJ) Inject as directed.     omeprazole  (PRILOSEC)  40 MG capsule Take 1 capsule (40 mg total) by mouth daily. (Patient taking differently: Take 40 mg by mouth daily as needed.) 30 capsule 2   thyroid (ARMOUR) 32.5 MG tablet Take 32.5 mg by mouth daily.     No current facility-administered medications for this visit.    Allergies as of 04/22/2024 - Review Complete 04/22/2024  Allergen Reaction Noted   Decadron [dexamethasone] Other (See Comments) 04/22/2024   Ivp dye [iodinated contrast media]  04/09/2017   Macrodantin [nitrofurantoin] Swelling 05/08/2014   Morphine and codeine  05/08/2014   Reglan [metoclopramide]  05/08/2014   Vicodin [hydrocodone-acetaminophen ]  04/09/2017   Diflucan [fluconazole] Rash 04/22/2024    Family History  Problem Relation Age of Onset   Stroke Mother    Heart attack Mother    Diabetes Father    Heart disease Brother    Colon cancer Maternal Aunt    Stomach cancer  Paternal Aunt    Esophageal cancer Neg Hx     Social History   Tobacco Use   Smoking status: Never   Smokeless tobacco: Never  Vaping Use   Vaping status: Never Used  Substance Use Topics   Alcohol use: No   Drug use: No     Review of Systems:    Constitutional: No weight loss, fever, chills Cardiovascular: No chest pain Respiratory: No SOB Gastrointestinal: See HPI and otherwise negative   Physical Exam:  Vital signs: BP 108/64   Pulse 64   Ht 5' 4.25 (1.632 m)   Wt 118 lb (53.5 kg)   BMI 20.10 kg/m   Wt Readings from Last 3 Encounters:  04/22/24 118 lb (53.5 kg)  04/15/24 117 lb (53.1 kg)  03/05/24 117 lb (53.1 kg)    Constitutional: Pleasant, well-appearing female in NAD, alert and cooperative Head:  Normocephalic and atraumatic.  Eyes: No scleral icterus.  Respiratory: Respirations even and unlabored. Lungs clear to auscultation bilaterally.  No wheezes, crackles, or rhonchi.  Cardiovascular:  Regular rate and rhythm. No murmurs. No peripheral edema. Gastrointestinal:  Soft, nondistended, mildly tender to palpation of epigastrium. No rebound or guarding. Normal bowel sounds. No appreciable masses or hepatomegaly. Rectal:  Not performed.  Neurologic:  Alert and oriented x4;  grossly normal neurologically.  Skin:   Dry and intact without significant lesions or rashes. Psychiatric: Oriented to person, place and time. Demonstrates good judgement and reason without abnormal affect or behaviors.   RELEVANT LABS AND IMAGING: CBC    Component Value Date/Time   WBC 4.3 03/05/2024 1106   RBC 4.76 03/05/2024 1106   HGB 14.6 03/05/2024 1106   HCT 43.5 03/05/2024 1106   PLT 235.0 03/05/2024 1106   MCV 91.4 03/05/2024 1106   MCH 30.9 04/09/2017 0220   MCHC 33.6 03/05/2024 1106   RDW 13.0 03/05/2024 1106   LYMPHSABS 1.2 03/05/2024 1106   MONOABS 0.4 03/05/2024 1106   EOSABS 0.1 03/05/2024 1106   BASOSABS 0.0 03/05/2024 1106    CMP     Component Value  Date/Time   NA 139 03/05/2024 1106   K 3.9 03/05/2024 1106   CL 101 03/05/2024 1106   CO2 33 (H) 03/05/2024 1106   GLUCOSE 96 03/05/2024 1106   BUN 12 03/05/2024 1106   CREATININE 0.74 03/05/2024 1106   CALCIUM 9.7 03/05/2024 1106   PROT 7.7 03/05/2024 1106   ALBUMIN 4.7 03/05/2024 1106   AST 25 03/05/2024 1106   ALT 14 03/05/2024 1106   ALKPHOS 73 03/05/2024 1106   BILITOT 0.8  03/05/2024 1106   GFRNONAA >60 04/09/2017 0220   GFRAA >60 04/09/2017 0220     Assessment/Plan:   Constipation Loose stools Abdominal bloating Epigastric abdominal pain Patient seen today for follow-up of loose stools and epigastric pain.  History of chronic constipation and on CT in April was found to have a stool burden consistent with constipation.  Recent KUB also showed a moderate stool burden. She underwent EGD/colonoscopy 04/15/2024.  On colonoscopy found to have 3 diminutive polyps, 1 of which was a tubular adenoma, tortuous colon, diverticulosis, internal hemorrhoids.  Advised to have recall in 3 years. On EGD found to have erythematous mucosa in the gastric antrum and body and biopsies were negative for H. Pylori. Today states loose stools have improved and she is feeling more constipated, does still have intermittent abdominal bloating, as well as intermittent burning epigastric pain.  Has not been taking omeprazole  but is open to trying this. Currently on antibiotics so we will need to hold off on SIBO breath testing but we will go ahead and give her the kit and advised her to wait 4 weeks post antibiotic treatment to complete this.  - Order SIBO breath test, to be completed so sooner than 4 weeks after completing antibiotics - Recommended Benefiber 1 tablespoon daily - Recommended MiraLAX 1 capful daily as needed for constipation - Start omeprazole  40 mg daily for epigastric pain - 3 month follow up   Camie Furbish, PA-C [facility] 04/22/2024, 10:41 AM  Patient Care  Team: Luiz Tillman HERO, [place] as PCP - General (Physician Assistant)

## 2024-04-24 NOTE — Progress Notes (Signed)
 ____________________________________________________________  Attending physician addendum:  Thank you for sending this case to me. I have reviewed the entire note and agree with the plan.   Victory Brand, MD  ____________________________________________________________

## 2024-08-13 ENCOUNTER — Telehealth: Payer: Self-pay | Admitting: Gastroenterology

## 2024-08-13 NOTE — Telephone Encounter (Signed)
 Incoming call from pt regarding unusual stool. Pt stated her stool is loose. Please advise. Thank you

## 2024-08-14 NOTE — Telephone Encounter (Signed)
 Left message on machine to call back

## 2024-08-15 NOTE — Telephone Encounter (Signed)
 Left message on machine to call back

## 2024-08-21 NOTE — Telephone Encounter (Signed)
 Following 2 attempts to reach patient with no return call/correspondence, we will cease attempts to reach patient at this time.
